# Patient Record
Sex: Female | Born: 1955 | Smoking: Never smoker
Health system: Southern US, Community
[De-identification: ages and names within clinical notes are randomized; demographics above are authoritative.]

## PROBLEM LIST (undated history)

## (undated) DIAGNOSIS — M1990 Unspecified osteoarthritis, unspecified site: Secondary | ICD-10-CM

## (undated) DIAGNOSIS — F329 Major depressive disorder, single episode, unspecified: Secondary | ICD-10-CM

## (undated) DIAGNOSIS — E039 Hypothyroidism, unspecified: Secondary | ICD-10-CM

## (undated) DIAGNOSIS — I10 Essential (primary) hypertension: Secondary | ICD-10-CM

## (undated) DIAGNOSIS — R0989 Other specified symptoms and signs involving the circulatory and respiratory systems: Secondary | ICD-10-CM

## (undated) DIAGNOSIS — F32A Depression, unspecified: Secondary | ICD-10-CM

## (undated) HISTORY — DX: Depression, unspecified: F32.A

## (undated) HISTORY — PX: TONSILLECTOMY AND ADENOIDECTOMY: SUR1326

## (undated) HISTORY — DX: Unspecified osteoarthritis, unspecified site: M19.90

## (undated) HISTORY — DX: Essential (primary) hypertension: I10

## (undated) HISTORY — DX: Other specified symptoms and signs involving the circulatory and respiratory systems: R09.89

## (undated) HISTORY — PX: DILATION AND CURETTAGE OF UTERUS: SHX78

## (undated) HISTORY — DX: Hypothyroidism, unspecified: E03.9

## (undated) HISTORY — PX: OTHER SURGICAL HISTORY: SHX169

---
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
## 1999-08-10 HISTORY — PX: GASTRIC BYPASS: SHX52

## 2005-08-09 HISTORY — PX: LAPAROSCOPIC VAGINAL HYSTERECTOMY WITH SALPINGO OOPHORECTOMY: SHX6681

## 2008-08-09 HISTORY — PX: REPLACEMENT TOTAL KNEE: SUR1224

## 2010-08-09 HISTORY — PX: OTHER SURGICAL HISTORY: SHX169

## 2019-02-28 DIAGNOSIS — R748 Abnormal levels of other serum enzymes: Secondary | ICD-10-CM | POA: Insufficient documentation

## 2019-02-28 DIAGNOSIS — Z9884 Bariatric surgery status: Secondary | ICD-10-CM | POA: Insufficient documentation

## 2019-02-28 DIAGNOSIS — M25561 Pain in right knee: Secondary | ICD-10-CM | POA: Insufficient documentation

## 2019-02-28 DIAGNOSIS — G8929 Other chronic pain: Secondary | ICD-10-CM | POA: Insufficient documentation

## 2019-10-16 ENCOUNTER — Inpatient Hospital Stay: Payer: Managed Care, Other (non HMO) | Attending: Oncology | Admitting: Oncology

## 2019-10-16 ENCOUNTER — Inpatient Hospital Stay: Payer: Managed Care, Other (non HMO)

## 2019-10-16 ENCOUNTER — Other Ambulatory Visit: Payer: Self-pay

## 2019-10-16 ENCOUNTER — Encounter (INDEPENDENT_AMBULATORY_CARE_PROVIDER_SITE_OTHER): Payer: Self-pay

## 2019-10-16 ENCOUNTER — Encounter: Payer: Self-pay | Admitting: Oncology

## 2019-10-16 VITALS — BP 191/96 | Temp 97.0°F | Resp 16 | Ht 64.0 in | Wt 292.0 lb

## 2019-10-16 DIAGNOSIS — R748 Abnormal levels of other serum enzymes: Secondary | ICD-10-CM | POA: Diagnosis present

## 2019-10-16 DIAGNOSIS — Z9884 Bariatric surgery status: Secondary | ICD-10-CM

## 2019-10-16 DIAGNOSIS — F329 Major depressive disorder, single episode, unspecified: Secondary | ICD-10-CM | POA: Diagnosis not present

## 2019-10-16 DIAGNOSIS — I10 Essential (primary) hypertension: Secondary | ICD-10-CM | POA: Insufficient documentation

## 2019-10-16 DIAGNOSIS — E039 Hypothyroidism, unspecified: Secondary | ICD-10-CM | POA: Insufficient documentation

## 2019-10-16 DIAGNOSIS — E038 Other specified hypothyroidism: Secondary | ICD-10-CM

## 2019-10-16 LAB — CBC WITH DIFFERENTIAL/PLATELET
Abs Immature Granulocytes: 0.02 10*3/uL (ref 0.00–0.07)
Basophils Absolute: 0.1 10*3/uL (ref 0.0–0.1)
Basophils Relative: 1 %
Eosinophils Absolute: 0.2 10*3/uL (ref 0.0–0.5)
Eosinophils Relative: 3 %
HCT: 40.2 % (ref 36.0–46.0)
Hemoglobin: 12.1 g/dL (ref 12.0–15.0)
Immature Granulocytes: 0 %
Lymphocytes Relative: 24 %
Lymphs Abs: 1.7 10*3/uL (ref 0.7–4.0)
MCH: 24.8 pg — ABNORMAL LOW (ref 26.0–34.0)
MCHC: 30.1 g/dL (ref 30.0–36.0)
MCV: 82.5 fL (ref 80.0–100.0)
Monocytes Absolute: 0.6 10*3/uL (ref 0.1–1.0)
Monocytes Relative: 9 %
Neutro Abs: 4.2 10*3/uL (ref 1.7–7.7)
Neutrophils Relative %: 63 %
Platelets: 326 10*3/uL (ref 150–400)
RBC: 4.87 MIL/uL (ref 3.87–5.11)
RDW: 15.5 % (ref 11.5–15.5)
WBC: 6.8 10*3/uL (ref 4.0–10.5)
nRBC: 0 % (ref 0.0–0.2)

## 2019-10-16 LAB — COMPREHENSIVE METABOLIC PANEL
ALT: 15 U/L (ref 0–44)
AST: 21 U/L (ref 15–41)
Albumin: 4.2 g/dL (ref 3.5–5.0)
Alkaline Phosphatase: 178 U/L — ABNORMAL HIGH (ref 38–126)
Anion gap: 12 (ref 5–15)
BUN: 20 mg/dL (ref 8–23)
CO2: 27 mmol/L (ref 22–32)
Calcium: 9.4 mg/dL (ref 8.9–10.3)
Chloride: 98 mmol/L (ref 98–111)
Creatinine, Ser: 0.97 mg/dL (ref 0.44–1.00)
GFR calc Af Amer: >60 mL/min (ref >60)
GFR calc non Af Amer: >60 mL/min (ref >60)
Glucose, Bld: 88 mg/dL (ref 70–99)
Potassium: 3.6 mmol/L (ref 3.5–5.1)
Sodium: 137 mmol/L (ref 135–145)
Total Bilirubin: 0.7 mg/dL (ref 0.3–1.2)
Total Protein: 8.1 g/dL (ref 6.5–8.1)

## 2019-10-16 NOTE — Progress Notes (Signed)
New patient for evaluation of abnormal labs.

## 2019-10-16 NOTE — Progress Notes (Signed)
Hematology/Oncology Consult note [facility] Telephone:([phone] [phone]   Patient Care Team: Marguerita Merles, MD as PCP - General (Family Medicine)  REFERRING PROVIDER: Marguerita Merles, MD  CHIEF COMPLAINTS/REASON FOR VISIT:  Evaluation of alkaline phosphatase elevation  HISTORY OF PRESENTING ILLNESS:   Dawn Turner is a  64 y.o.  female with PMH listed below was seen in consultation at the request of  Marguerita Merles, MD  for evaluation of alkaline phosphatase elevation Patient has history of chronic elevated alkaline phosphatase dating back at least to 2018. Patient had blood work done at primary care doctor's office and records were faxed to [place] and scanned into EMR. Labs were reviewed. 12/31 /2021 alkaline phosphatase 234, AST 21, ALT 11, GGT 14.  Albumin 4.6 TSH 1.51, 25 hydroxy vitamin D 25.5. Recent liver studies were normal. History of gastric bypass in 2001.  Patient denies any other complaints.  Denies fatigue, unintentional weight loss, night sweats, fever or chills. Patient has history of vitamin D deficiency.  Patient takes vitamin D 400 units daily. Patient was seen by endocrinologist at [facility] in July 2020.  Paget disease work-up was done and was negative.  Patient Had skeletal bone survey which was essentially negative, no area of sclerosis, lucency or other evidence of Paget's disease. No destructive osseous lesions.  Today in the clinic, patient's blood pressure is extremely elevated.  She reports that she has recently been started on carvedilol for 4 days.  Also is on losartan.  She was previously on amlodipine which caused severe lower extremity edema and has to be stopped.  Review of Systems  Constitutional: Negative for appetite change, chills, fatigue and fever.  HENT:   Negative for hearing loss and voice change.   Eyes: Negative for eye problems.  Respiratory: Negative for chest tightness and cough.   Cardiovascular:  Negative for chest pain.  Gastrointestinal: Negative for abdominal distention, abdominal pain and blood in stool.  Endocrine: Negative for hot flashes.  Genitourinary: Negative for difficulty urinating and frequency.   Musculoskeletal: Negative for arthralgias.  Skin: Negative for itching and rash.  Neurological: Negative for extremity weakness.  Hematological: Negative for adenopathy.  Psychiatric/Behavioral: Negative for confusion.    MEDICAL HISTORY:  Past Medical History:  Diagnosis Date  . Arthritis   . Depression   . Hypertension   . Hypothyroid   . Inadequate peripheral blood flow     SURGICAL HISTORY: Past Surgical History:  Procedure Laterality Date  . angiogram with stent behind right knee Right 2012  . DILATION AND CURETTAGE OF UTERUS  1933  . GASTRIC BYPASS  2001  . LAPAROSCOPIC VAGINAL HYSTERECTOMY WITH SALPINGO OOPHORECTOMY  2007  . removal of excess skin     breast, abdomen, arms, thighs  . REPLACEMENT TOTAL KNEE Right 2010  . TONSILLECTOMY AND ADENOIDECTOMY  1965, 1966    SOCIAL HISTORY: Social History   Socioeconomic History  . Marital status: Unknown    Spouse name: Not on file  . Number of children: Not on file  . Years of education: Not on file  . Highest education level: Not on file  Occupational History  . Not on file  Tobacco Use  . Smoking status: Never Smoker  . Smokeless tobacco: Never Used  Substance and Sexual Activity  . Alcohol use: Never  . Drug use: Never  . Sexual activity: Yes  Other Topics Concern  . Not on file  Social History Narrative  . Not on file  Social Determinants of Health   Financial Resource Strain:   . Difficulty of Paying Living Expenses: Not on file  Food Insecurity:   . Worried About Charity fundraiser in the Last Year: Not on file  . Ran Out of Food in the Last Year: Not on file  Transportation Needs:   . Lack of Transportation (Medical): Not on file  . Lack of Transportation (Non-Medical): Not on  file  Physical Activity:   . Days of Exercise per Week: Not on file  . Minutes of Exercise per Session: Not on file  Stress:   . Feeling of Stress : Not on file  Social Connections:   . Frequency of Communication with Friends and Family: Not on file  . Frequency of Social Gatherings with Friends and Family: Not on file  . Attends Religious Services: Not on file  . Active Member of Clubs or Organizations: Not on file  . Attends Archivist Meetings: Not on file  . Marital Status: Not on file  Intimate Partner Violence:   . Fear of Current or Ex-Partner: Not on file  . Emotionally Abused: Not on file  . Physically Abused: Not on file  . Sexually Abused: Not on file    FAMILY HISTORY: Family History  Problem Relation Age of Onset  . Melanoma Father   . Kidney cancer Sister   . Lung cancer Paternal Grandfather     ALLERGIES:  is allergic to lyrica [pregabalin] and azithromycin.  MEDICATIONS:  Current Outpatient Medications  Medication Sig Dispense Refill  . acyclovir (ZOVIRAX) 400 MG tablet Take by mouth.    . ARIPiprazole (ABILIFY) 5 MG tablet Take 2.5 mg by mouth daily.    Marland Kitchen atorvastatin (LIPITOR) 20 MG tablet Take by mouth in the morning and at bedtime.     . carvedilol (COREG) 12.5 MG tablet Take 12.5 mg by mouth 2 (two) times daily.    . clopidogrel (PLAVIX) 75 MG tablet Take by mouth.    . cyanocobalamin 1000 MCG tablet Take 1,000 mcg by mouth daily.    . DULoxetine (CYMBALTA) 30 MG capsule Take 30 mg by mouth daily.    Marland Kitchen levothyroxine (SYNTHROID) 88 MCG tablet Take 88 mcg by mouth daily.    Marland Kitchen losartan-hydrochlorothiazide (HYZAAR) 100-25 MG tablet Take 1 tablet by mouth daily.    . Multiple Vitamin (MULTI-VITAMIN) tablet Take by mouth.    Marland Kitchen tiZANidine (ZANAFLEX) 4 MG tablet Take up to 2 tabs (8 mg) by mouth every 8 hours as needed for muscle pain.    . Vitamin D, Cholecalciferol, 10 MCG (400 UNIT) CAPS Take 1 capsule by mouth daily.     No current  facility-administered medications for this visit.     PHYSICAL EXAMINATION: ECOG PERFORMANCE STATUS: 0 - Asymptomatic Vitals:   10/16/19 1429  BP: (!) 191/96  Resp: 16  Temp: (!) 97 F (36.1 C)   Filed Weights   10/16/19 1429  Weight: 292 lb (132.5 kg)    Physical Exam Constitutional:      General: She is not in acute distress. HENT:     Head: Normocephalic and atraumatic.  Eyes:     General: No scleral icterus. Cardiovascular:     Rate and Rhythm: Normal rate and regular rhythm.     Heart sounds: Normal heart sounds.  Pulmonary:     Effort: Pulmonary effort is normal. No respiratory distress.     Breath sounds: No wheezing.  Abdominal:     General: Bowel  sounds are normal. There is no distension.     Palpations: Abdomen is soft.  Musculoskeletal:        General: No deformity. Normal range of motion.     Cervical back: Normal range of motion and neck supple.  Skin:    General: Skin is warm and dry.     Findings: No erythema or rash.  Neurological:     Mental Status: She is alert and oriented to person, place, and time. Mental status is at baseline.     Cranial Nerves: No cranial nerve deficit.     Coordination: Coordination normal.  Psychiatric:        Mood and Affect: Mood normal.     LABORATORY DATA:  I have reviewed the data as listed Lab Results  Component Value Date   WBC 6.8 10/16/2019   HGB 12.1 10/16/2019   HCT 40.2 10/16/2019   MCV 82.5 10/16/2019   PLT 326 10/16/2019   Recent Labs    10/16/19 1509  NA 137  K 3.6  CL 98  CO2 27  GLUCOSE 88  BUN 20  CREATININE 0.97  CALCIUM 9.4  GFRNONAA >60  GFRAA >60  PROT 8.1  ALBUMIN 4.2  AST 21  ALT 15  ALKPHOS 178*  BILITOT 0.7   Iron/TIBC/Ferritin/ %Sat No results found for: IRON, TIBC, FERRITIN, IRONPCTSAT    RADIOGRAPHIC STUDIES: I have personally reviewed the radiological images as listed and agreed with the findings in the report. No results found.  03/16/2019 skeletal bone survey  showed no destructive osseous lesions.  No area of sclerosis, lucency or other evidence of Paget disease.  ASSESSMENT & PLAN:  1. Elevated alkaline phosphatase level   2. Uncontrolled hypertension   Chronic history of elevated alkaline phosphatase with normal liver function and normal GGT.  Less likely hepatic origin. Possibly from bone origin. Patient has had endocrinology work-up for Paget's disease which was negative. Endocrinology recommendation from [place] system was reviewed. Alkaline phosphatase, bone specific was done at [facility] which was elevated at 34. Patient is at high risk for osteomalacia, PTH was elevated and secondary hyperparathyroidism related to her gastric bypass may contribute to alkaline phosphatase elevation.  She was recommended for bisphosphonate treatments. Discussed with patient that she has had a good work-up for elevated alkaline phosphatase. Check CBC, CMP, multiple myeloma panel, light chain ratio.If work-up is negative, recommend patient continue follow-up with  Endocrinology.  #Uncontrolled hypertension, BP 191/96.  And 170/125.  Patient does not want to go to emergency room for blood pressure control.  There may be an component of anxiety from establish care at the cancer center.  She is asymptomatic. Advised patient to measure blood pressure at home and follow-up closely with primary care provider regarding management of uncontrolled hypertension. Orders Placed This Encounter  Procedures  . CBC with Differential/Platelet    Standing Status:   Future    Number of Occurrences:   1    Standing Expiration Date:   10/15/2020  . Comprehensive metabolic panel    Standing Status:   Future    Number of Occurrences:   1    Standing Expiration Date:   10/15/2020  . Multiple Myeloma Panel (SPEP&IFE w/QIG)    Standing Status:   Future    Number of Occurrences:   1    Standing Expiration Date:   10/15/2020  . Kappa/lambda light chains    Standing Status:   Future    Number  of Occurrences:   1  Standing Expiration Date:   10/15/2020  . Parathyroid hormone, intact (no Ca)    Standing Status:   Future    Number of Occurrences:   1    Standing Expiration Date:   10/15/2020    All questions were answered. The patient knows to call the clinic with any problems questions or concerns.  cc Marguerita Merles, MD    Return of visit: 2 weeks Thank you for this kind referral and the opportunity to participate in the care of this patient. A copy of today's note is routed to referring provider    Earlie Server, MD, PhD Hematology Oncology [facility] Pager- [phone] 10/16/2019

## 2019-10-17 LAB — PARATHYROID HORMONE, INTACT (NO CA): PTH: 78 pg/mL — ABNORMAL HIGH (ref 15–65)

## 2019-10-17 LAB — KAPPA/LAMBDA LIGHT CHAINS
Kappa free light chain: 37 mg/L — ABNORMAL HIGH (ref 3.3–19.4)
Kappa, lambda light chain ratio: 1.5 (ref 0.26–1.65)
Lambda free light chains: 24.7 mg/L (ref 5.7–26.3)

## 2019-10-19 LAB — MULTIPLE MYELOMA PANEL, SERUM
Albumin SerPl Elph-Mcnc: 3.9 g/dL (ref 2.9–4.4)
Albumin/Glob SerPl: 1.2 (ref 0.7–1.7)
Alpha 1: 0.3 g/dL (ref 0.0–0.4)
Alpha2 Glob SerPl Elph-Mcnc: 1.1 g/dL — ABNORMAL HIGH (ref 0.4–1.0)
B-Globulin SerPl Elph-Mcnc: 1.2 g/dL (ref 0.7–1.3)
Gamma Glob SerPl Elph-Mcnc: 0.8 g/dL (ref 0.4–1.8)
Globulin, Total: 3.4 g/dL (ref 2.2–3.9)
IgA: 246 mg/dL (ref 87–352)
IgG (Immunoglobin G), Serum: 1029 mg/dL (ref 586–1602)
IgM (Immunoglobulin M), Srm: 32 mg/dL (ref 26–217)
Total Protein ELP: 7.3 g/dL (ref 6.0–8.5)

## 2019-10-31 ENCOUNTER — Inpatient Hospital Stay: Payer: Managed Care, Other (non HMO) | Admitting: Oncology

## 2019-10-31 ENCOUNTER — Telehealth: Payer: Self-pay

## 2019-10-31 NOTE — Telephone Encounter (Signed)
Done. Per patient request 10/31/19 MyChart Visit has been R/S to 11/06/19 Called and left a detailed message making her aware her her scheduled appt time for date requested.

## 2019-10-31 NOTE — Telephone Encounter (Signed)
Contacted patient to update her chart for virtual visit scheduled with Dr. Cathie Hoops @ 2:15.  Patient is not able to do the visit today and would like to have r/s for next Tu.  Told her I would forward message to scheduling for them to review appt availability.

## 2019-11-06 ENCOUNTER — Encounter: Payer: Self-pay | Admitting: Oncology

## 2019-11-06 ENCOUNTER — Inpatient Hospital Stay (HOSPITAL_BASED_OUTPATIENT_CLINIC_OR_DEPARTMENT_OTHER): Payer: Managed Care, Other (non HMO) | Admitting: Oncology

## 2019-11-06 DIAGNOSIS — R748 Abnormal levels of other serum enzymes: Secondary | ICD-10-CM

## 2019-11-06 NOTE — Progress Notes (Signed)
Patient verified using two identifiers for virtual visit via telephone today.  Patient does not offer any problems today.  

## 2019-11-06 NOTE — Progress Notes (Signed)
HEMATOLOGY-ONCOLOGY TeleHEALTH VISIT PROGRESS NOTE  I connected with Dawn Turner on 11/06/19 at  2:30 PM EDT by video enabled telemedicine visit and verified that I am speaking with the correct person using two identifiers. I discussed the limitations, risks, security and privacy concerns of performing an evaluation and management service by telemedicine and the availability of in-person appointments. I also discussed with the patient that there may be a patient responsible charge related to this service. The patient expressed understanding and agreed to proceed.   Other persons participating in the visit and their role in the encounter:  None  Patient's location: Home  Provider's location: office Chief Complaint: Alkaline phosphatase elevation.   INTERVAL HISTORY Dawn Turner is a 64 y.o. female who has above history reviewed by me today presents for follow up visit for management of alkaline phosphatase elevation Problems and complaints are listed below:  Patient has no new complaints.  She did blood work during the interval and presents virtually for discussion.  Review of Systems  Constitutional: Negative for appetite change, chills, fatigue and fever.  HENT:   Negative for hearing loss and voice change.   Eyes: Negative for eye problems.  Respiratory: Negative for chest tightness and cough.   Cardiovascular: Negative for chest pain.  Gastrointestinal: Negative for abdominal distention, abdominal pain and blood in stool.  Endocrine: Negative for hot flashes.  Genitourinary: Negative for difficulty urinating and frequency.   Musculoskeletal: Negative for arthralgias.  Skin: Negative for itching and rash.  Neurological: Negative for extremity weakness.  Hematological: Negative for adenopathy.  Psychiatric/Behavioral: Negative for confusion.    Past Medical History:  Diagnosis Date  . Arthritis   . Depression   . Hypertension   . Hypothyroid   . Inadequate peripheral  blood flow    Past Surgical History:  Procedure Laterality Date  . angiogram with stent behind right knee Right 2012  . DILATION AND CURETTAGE OF UTERUS  1933  . GASTRIC BYPASS  2001  . LAPAROSCOPIC VAGINAL HYSTERECTOMY WITH SALPINGO OOPHORECTOMY  2007  . removal of excess skin     breast, abdomen, arms, thighs  . REPLACEMENT TOTAL KNEE Right 2010  . TONSILLECTOMY AND ADENOIDECTOMY  1965, 1966    Family History  Problem Relation Age of Onset  . Melanoma Father   . Kidney cancer Sister   . Lung cancer Paternal Grandfather     Social History   Socioeconomic History  . Marital status: Unknown    Spouse name: Not on file  . Number of children: Not on file  . Years of education: Not on file  . Highest education level: Not on file  Occupational History  . Not on file  Tobacco Use  . Smoking status: Never Smoker  . Smokeless tobacco: Never Used  Substance and Sexual Activity  . Alcohol use: Never  . Drug use: Never  . Sexual activity: Yes  Other Topics Concern  . Not on file  Social History Narrative  . Not on file   Social Determinants of Health   Financial Resource Strain:   . Difficulty of Paying Living Expenses:   Food Insecurity:   . Worried About Charity fundraiser in the Last Year:   . Arboriculturist in the Last Year:   Transportation Needs:   . Film/video editor (Medical):   Marland Kitchen Lack of Transportation (Non-Medical):   Physical Activity:   . Days of Exercise per Week:   . Minutes of Exercise per Session:  Stress:   . Feeling of Stress :   Social Connections:   . Frequency of Communication with Friends and Family:   . Frequency of Social Gatherings with Friends and Family:   . Attends Religious Services:   . Active Member of Clubs or Organizations:   . Attends Archivist Meetings:   Marland Kitchen Marital Status:   Intimate Partner Violence:   . Fear of Current or Ex-Partner:   . Emotionally Abused:   Marland Kitchen Physically Abused:   . Sexually Abused:      Current Outpatient Medications on File Prior to Visit  Medication Sig Dispense Refill  . acyclovir (ZOVIRAX) 400 MG tablet Take by mouth.    . ARIPiprazole (ABILIFY) 5 MG tablet Take 2.5 mg by mouth daily.    Marland Kitchen atorvastatin (LIPITOR) 20 MG tablet Take by mouth in the morning and at bedtime.     . carvedilol (COREG) 12.5 MG tablet Take 25 mg by mouth 2 (two) times daily.     . clopidogrel (PLAVIX) 75 MG tablet Take by mouth.    . cyanocobalamin 1000 MCG tablet Take 1,000 mcg by mouth daily.    . DULoxetine (CYMBALTA) 30 MG capsule Take 30 mg by mouth daily.    Marland Kitchen levothyroxine (SYNTHROID) 88 MCG tablet Take 88 mcg by mouth daily.    Marland Kitchen losartan-hydrochlorothiazide (HYZAAR) 100-25 MG tablet Take 1 tablet by mouth daily.    . Multiple Vitamin (MULTI-VITAMIN) tablet Take by mouth.    Marland Kitchen tiZANidine (ZANAFLEX) 4 MG tablet Take up to 2 tabs (8 mg) by mouth every 8 hours as needed for muscle pain.    . Vitamin D, Cholecalciferol, 10 MCG (400 UNIT) CAPS Take 1 capsule by mouth daily.     No current facility-administered medications on file prior to visit.    Allergies  Allergen Reactions  . Lyrica [Pregabalin]     Stroke like symptoms  . Azithromycin Nausea Only       Observations/Objective: Today's Vitals   11/06/19 1345  PainSc: 0-No pain   There is no height or weight on file to calculate BMI.  Physical Exam  Constitutional: No distress.  Neurological: She is alert.    CBC    Component Value Date/Time   WBC 6.8 10/16/2019 1509   RBC 4.87 10/16/2019 1509   HGB 12.1 10/16/2019 1509   HCT 40.2 10/16/2019 1509   PLT 326 10/16/2019 1509   MCV 82.5 10/16/2019 1509   MCH 24.8 (L) 10/16/2019 1509   MCHC 30.1 10/16/2019 1509   RDW 15.5 10/16/2019 1509   LYMPHSABS 1.7 10/16/2019 1509   MONOABS 0.6 10/16/2019 1509   EOSABS 0.2 10/16/2019 1509   BASOSABS 0.1 10/16/2019 1509    CMP     Component Value Date/Time   NA 137 10/16/2019 1509   K 3.6 10/16/2019 1509   CL 98 10/16/2019  1509   CO2 27 10/16/2019 1509   GLUCOSE 88 10/16/2019 1509   BUN 20 10/16/2019 1509   CREATININE 0.97 10/16/2019 1509   CALCIUM 9.4 10/16/2019 1509   PROT 8.1 10/16/2019 1509   ALBUMIN 4.2 10/16/2019 1509   AST 21 10/16/2019 1509   ALT 15 10/16/2019 1509   ALKPHOS 178 (H) 10/16/2019 1509   BILITOT 0.7 10/16/2019 1509   GFRNONAA >60 10/16/2019 1509   GFRAA >60 10/16/2019 1509     Assessment and Plan: 1. Elevated alkaline phosphatase level     Elevation of alkaline phosphatase level, Labs reviewed and discussed with patient. Multiple  myeloma panel is negative. Patient has had a skeletal survey done at [facility] and was negative. She has also had endocrinology work-up as well and was felt not to be related to Paget disease. PTH was elevated. I agree with her endocrinologist that chronic alkaline phosphatase elevation is probably due to secondary hyperparathyroidism from gastric bypass.  She has been recommended to take calcium supplementation. Per endocrinology note, oral or IV bisphosphonates can be considered.  I will defer to endocrinology. Encourage patient to further discuss with primary care provider/endocrinologist regarding treatment of secondary hyperparathyroidism. No need for follow-up as of now..  Should she develop additional related symptoms or concerns in the future, I am happy to see her.     I discussed the assessment and treatment plan with the patient. The patient was provided an opportunity to ask questions and all were answered. The patient agreed with the plan and demonstrated an understanding of the instructions.  The patient was advised to call back or seek an in-person evaluation if the symptoms worsen or if the condition fails to improve as anticipated.    Earlie Server, MD 11/06/2019 4:44 PM

## 2020-03-18 ENCOUNTER — Other Ambulatory Visit: Payer: Self-pay

## 2020-03-18 ENCOUNTER — Ambulatory Visit
Admission: RE | Admit: 2020-03-18 | Discharge: 2020-03-18 | Disposition: A | Discharge status: Home / Self Care | Payer: Managed Care, Other (non HMO) | Attending: Family Medicine | Admitting: Family Medicine

## 2020-03-18 ENCOUNTER — Ambulatory Visit
Admission: RE | Admit: 2020-03-18 | Discharge: 2020-03-18 | Disposition: A | Discharge status: Home / Self Care | Payer: Managed Care, Other (non HMO) | Source: Ambulatory Visit | Attending: Family Medicine | Admitting: Family Medicine

## 2020-03-18 ENCOUNTER — Other Ambulatory Visit: Payer: Self-pay | Admitting: Family Medicine

## 2020-03-18 DIAGNOSIS — R059 Cough, unspecified: Secondary | ICD-10-CM

## 2020-03-18 DIAGNOSIS — R05 Cough: Secondary | ICD-10-CM | POA: Diagnosis present

## 2020-05-07 ENCOUNTER — Ambulatory Visit
Admission: RE | Admit: 2020-05-07 | Discharge: 2020-05-07 | Disposition: A | Discharge status: Home / Self Care | Payer: Managed Care, Other (non HMO) | Attending: Family Medicine | Admitting: Family Medicine

## 2020-05-07 ENCOUNTER — Other Ambulatory Visit: Payer: Self-pay

## 2020-05-08 ENCOUNTER — Other Ambulatory Visit: Payer: Self-pay | Admitting: Family Medicine

## 2020-05-08 ENCOUNTER — Ambulatory Visit
Admission: RE | Admit: 2020-05-08 | Discharge: 2020-05-08 | Disposition: A | Discharge status: Home / Self Care | Payer: Managed Care, Other (non HMO) | Source: Ambulatory Visit | Attending: Family Medicine | Admitting: Family Medicine

## 2020-05-08 ENCOUNTER — Ambulatory Visit
Admission: RE | Admit: 2020-05-08 | Discharge: 2020-05-08 | Disposition: A | Discharge status: Home / Self Care | Payer: Managed Care, Other (non HMO) | Attending: Family Medicine | Admitting: Family Medicine

## 2020-05-08 DIAGNOSIS — R05 Cough: Secondary | ICD-10-CM | POA: Insufficient documentation

## 2020-05-08 DIAGNOSIS — R059 Cough, unspecified: Secondary | ICD-10-CM

## 2020-08-18 ENCOUNTER — Ambulatory Visit: Payer: Managed Care, Other (non HMO) | Attending: Otolaryngology

## 2020-08-18 DIAGNOSIS — E669 Obesity, unspecified: Secondary | ICD-10-CM | POA: Insufficient documentation

## 2020-08-18 DIAGNOSIS — I10 Essential (primary) hypertension: Secondary | ICD-10-CM | POA: Insufficient documentation

## 2021-01-19 ENCOUNTER — Other Ambulatory Visit: Payer: Self-pay

## 2021-01-22 DIAGNOSIS — R1110 Vomiting, unspecified: Secondary | ICD-10-CM | POA: Insufficient documentation

## 2021-01-22 DIAGNOSIS — R634 Abnormal weight loss: Secondary | ICD-10-CM | POA: Insufficient documentation

## 2021-01-22 DIAGNOSIS — E039 Hypothyroidism, unspecified: Secondary | ICD-10-CM | POA: Insufficient documentation

## 2021-01-22 DIAGNOSIS — Z9884 Bariatric surgery status: Secondary | ICD-10-CM | POA: Insufficient documentation

## 2021-01-22 DIAGNOSIS — I10 Essential (primary) hypertension: Secondary | ICD-10-CM | POA: Insufficient documentation

## 2021-01-22 DIAGNOSIS — R778 Other specified abnormalities of plasma proteins: Secondary | ICD-10-CM | POA: Insufficient documentation

## 2021-01-26 ENCOUNTER — Encounter: Payer: Self-pay | Admitting: Family Medicine

## 2021-03-23 ENCOUNTER — Ambulatory Visit (INDEPENDENT_AMBULATORY_CARE_PROVIDER_SITE_OTHER): Payer: Managed Care, Other (non HMO) | Admitting: Gastroenterology

## 2021-03-23 ENCOUNTER — Encounter: Payer: Self-pay | Admitting: Gastroenterology

## 2021-03-23 VITALS — BP 170/104 | HR 71 | Temp 97.6°F | Ht 64.0 in | Wt 250.8 lb

## 2021-03-23 DIAGNOSIS — R1032 Left lower quadrant pain: Secondary | ICD-10-CM

## 2021-03-23 NOTE — Progress Notes (Signed)
Gastroenterology Consultation  Referring Provider:     Marguerita Merles, MD Primary Care Physician:  Marguerita Merles, MD Primary Gastroenterologist:  Dr. Allen Norris     Reason for Consultation:     Abnormal alkaline phosphatase and LLQ pain        HPI:   Dawn Turner is a 65 y.o. y/o female referred for consultation & management of abnormal alkaline phosphatase by Dr. Lennox Grumbles, Connye Burkitt, MD.  This patient comes in today after having been found to have chronically elevated alkaline phosphatase that seems to have decreased over time.  The patient's trend of her alkaline phosphatase has shown:  AST  14 17 17  -- -- --  ALT  10 11 11  -- -- --  Alk Phos (alkaline Phosphatase) 107 High  148 High  193 High       With the dates of these labs being 7/22, 11/21 and 7/21 respectively.  The patient had a normal GGT in 2020. She had a colonoscopy in 2019 before she moved to [facility]. She reports that it was normal. The patient had a CT scna at [facility] in June for pain and was found to have intussusception. She had gastric bypass in 2001. The patient knows that her Alk phos is from her parathyroid and is seeing and endocrinologist.   The patient again reiterates that she is not having any further pain.  She also denies any change in bowel habits nausea vomiting fevers or chills.  Past Medical History:  Diagnosis Date   Arthritis    Depression    Hypertension    Hypothyroid    Inadequate peripheral blood flow     Past Surgical History:  Procedure Laterality Date   angiogram with stent behind right knee Right 2012   DILATION AND CURETTAGE OF UTERUS  1933   GASTRIC BYPASS  2001   LAPAROSCOPIC VAGINAL HYSTERECTOMY WITH SALPINGO OOPHORECTOMY  2007   removal of excess skin     breast, abdomen, arms, thighs   REPLACEMENT TOTAL KNEE Right 2010   TONSILLECTOMY AND ADENOIDECTOMY  1965, 1966    Prior to Admission medications   Medication Sig Start Date End Date Taking? Authorizing Provider  acyclovir (ZOVIRAX)  400 MG tablet Take by mouth. 08/11/19   [provider]  ARIPiprazole (ABILIFY) 5 MG tablet Take 2.5 mg by mouth daily.    [provider]  atorvastatin (LIPITOR) 20 MG tablet Take by mouth in the morning and at bedtime.  08/20/19   [provider]  carvedilol (COREG) 12.5 MG tablet Take 25 mg by mouth 2 (two) times daily.  10/08/19   [provider]  clopidogrel (PLAVIX) 75 MG tablet Take by mouth. 08/21/18   [provider]  cyanocobalamin 1000 MCG tablet Take 1,000 mcg by mouth daily.    [provider]  DULoxetine (CYMBALTA) 30 MG capsule Take 30 mg by mouth daily. 09/12/19   [provider]  levothyroxine (SYNTHROID) 88 MCG tablet Take 88 mcg by mouth daily. 09/12/19   [provider]  losartan-hydrochlorothiazide (HYZAAR) 100-25 MG tablet Take 1 tablet by mouth daily. 09/12/19   [provider]  Multiple Vitamin (MULTI-VITAMIN) tablet Take by mouth.    [provider]  tiZANidine (ZANAFLEX) 4 MG tablet Take up to 2 tabs (8 mg) by mouth every 8 hours as needed for muscle pain. 08/21/18   [provider]  Vitamin D, Cholecalciferol, 10 MCG (400 UNIT) CAPS Take 1 capsule by mouth daily.  [provider]    Family History  Problem Relation Age of Onset   Melanoma Father    Kidney cancer Sister    Lung cancer Paternal Grandfather      Social History   Tobacco Use   Smoking status: Never   Smokeless tobacco: Never  Substance Use Topics   Alcohol use: Never   Drug use: Never    Allergies as of 03/23/2021 - Review Complete 11/06/2019  Allergen Reaction Noted   Lyrica [pregabalin]  10/16/2019   Azithromycin Nausea Only 03/08/2017    Review of Systems:    All systems reviewed and negative except where noted in HPI.   Physical Exam:  There were no vitals taken for this visit. No LMP recorded. Patient has had a hysterectomy. General:   Alert,  Well-developed, well-nourished, pleasant  and cooperative in NAD Head:  Normocephalic and atraumatic. Eyes:  Sclera clear, no icterus.   Conjunctiva pink. Ears:  Normal auditory acuity. Neck:  Supple; no masses or thyromegaly. Lungs:  Respirations even and unlabored.  Clear throughout to auscultation.   No wheezes, crackles, or rhonchi. No acute distress. Heart:  Regular rate and rhythm; no murmurs, clicks, rubs, or gallops. Abdomen:  Normal bowel sounds.  No bruits.  Soft, non-tender and non-distended without masses, hepatosplenomegaly or hernias noted.  No guarding or rebound tenderness.  Negative Carnett sign.   Rectal:  Deferred.  Pulses:  Normal pulses noted. Extremities:  No clubbing or edema.  No cyanosis. Neurologic:  Alert and oriented x3;  grossly normal neurologically. Skin:  Intact without significant lesions or rashes.  No jaundice. Lymph Nodes:  No significant cervical adenopathy. Psych:  Alert and cooperative. Normal mood and affect.  Imaging Studies: No results found.  Assessment and Plan:   Dawn Turner is a 65 y.o. y/o female who comes in today with a history of increased alkaline phosphatase likely due to her parathyroid as reported by her.  She is following up with endocrinology for that.  As far as the patient's visit to the ER with left lower quadrant pain the patient is completely asymptomatic at this present time.  The pain may have been caused by the finding of intussusception on the CT scan but the CT scan did not show any other cause for the abdominal pain.  The patient has been told that if the abdominal pain comes back with is significant pain as previously that she should go back to the ER otherwise follow-up with me as needed.  The patient has been explained the plan and agrees with it.    Lucilla Lame, MD. Marval Regal    Note: This dictation was prepared with Dragon dictation along with smaller phrase technology. Any transcriptional errors that result from this process are unintentional.

## 2022-03-17 IMAGING — CR DG CHEST 2V
1 series · 2 of 2 positions shown · non-contrast
Comparison: 03/18/2020

CLINICAL DATA: Cough.  Recent pneumonia.

EXAM:
CHEST - 2 VIEW

[Series 1: dg chest 2 view · 0.14mm/px · 2 of 2 slices shown]
[im 1/2]
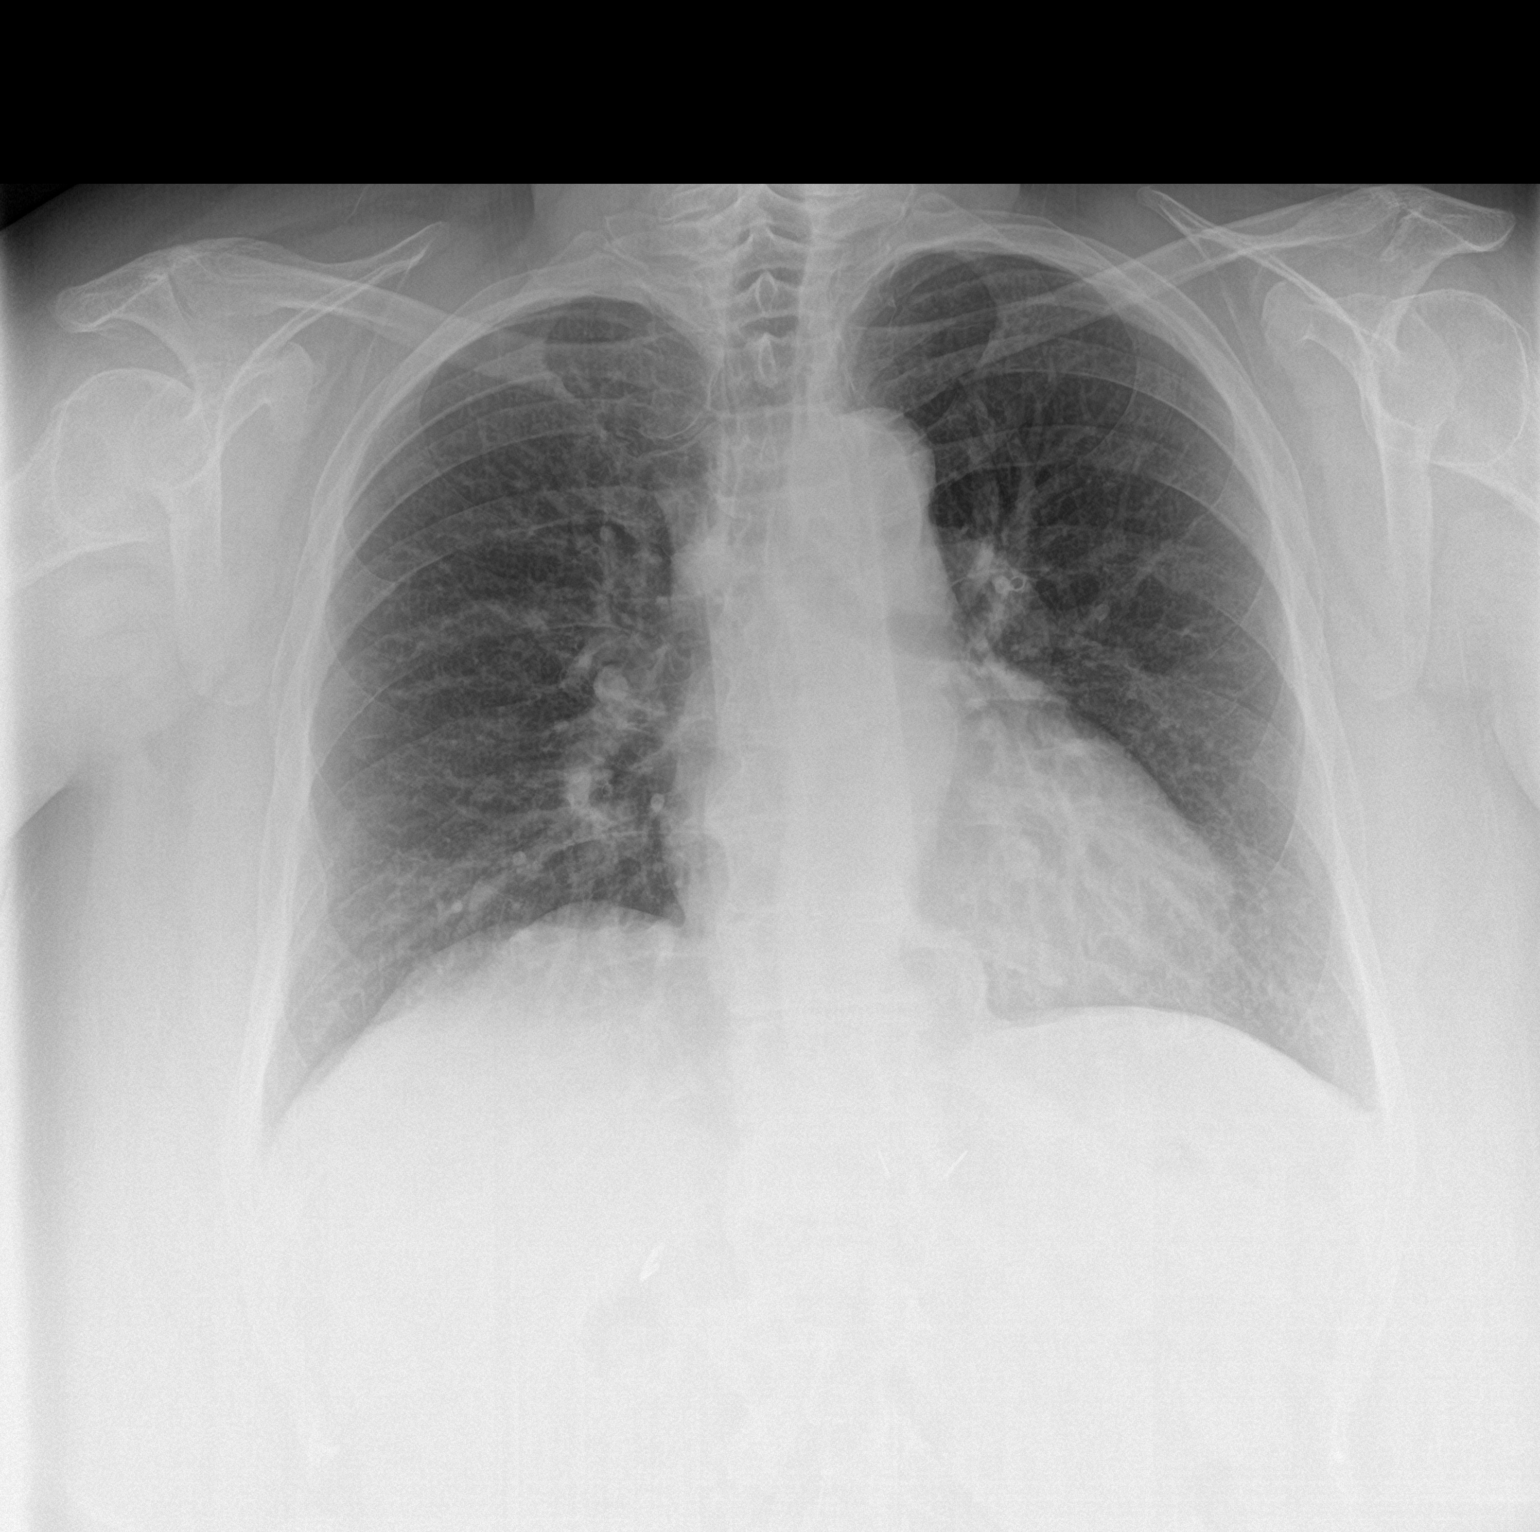
[im 2/2]
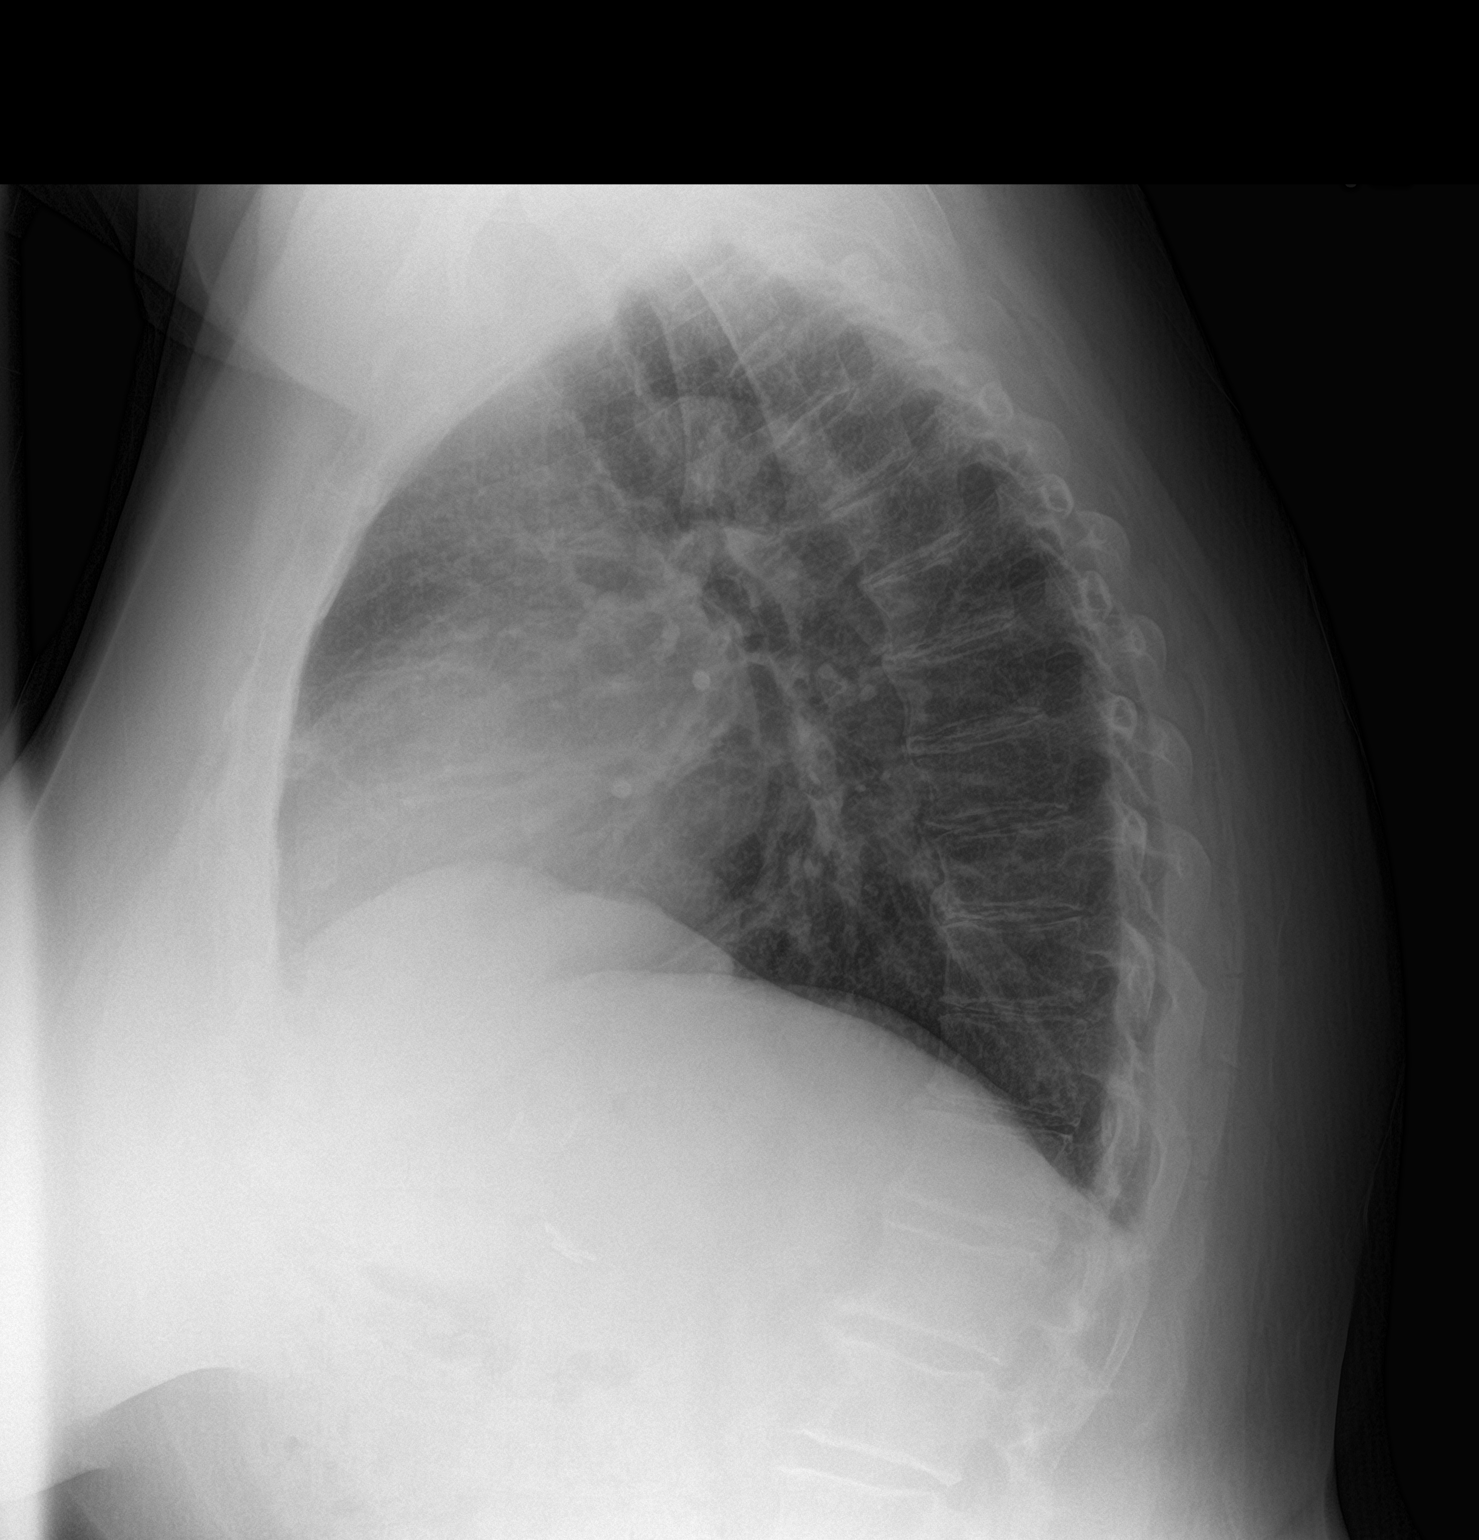

[2 of 2 positions shown; findings below may reference images not displayed]

FINDINGS: The heart size and mediastinal contours are within normal limits.
Both lungs are clear. Previously seen mild opacity in lateral left
lung base is no longer visualized. Small hiatal hernia again noted.
The visualized skeletal structures are unremarkable.
IMPRESSION: No active cardiopulmonary disease.

Small hiatal hernia.
